# Patient Record
Sex: Male | Born: 1990 | Race: Black or African American | Hispanic: No | Marital: Single | Smoking: Current every day smoker
Health system: Southern US, Community
[De-identification: ages and names within clinical notes are randomized; demographics above are authoritative.]

---
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
## 2009-03-08 ENCOUNTER — Ambulatory Visit: Payer: Self-pay | Admitting: Internal Medicine

## 2009-04-17 ENCOUNTER — Ambulatory Visit: Payer: Self-pay | Admitting: Family Medicine

## 2009-04-17 ENCOUNTER — Encounter (INDEPENDENT_AMBULATORY_CARE_PROVIDER_SITE_OTHER): Payer: Self-pay | Admitting: Family Medicine

## 2009-04-17 LAB — CONVERTED CEMR LAB
AST: 17 units/L (ref 0–37)
Alkaline Phosphatase: 73 units/L (ref 39–117)
BUN: 16 mg/dL (ref 6–23)
Basophils Absolute: 0 10*3/uL (ref 0.0–0.1)
Basophils Relative: 0 % (ref 0–1)
Creatinine, Ser: 0.8 mg/dL (ref 0.40–1.50)
Eosinophils Absolute: 0.1 10*3/uL (ref 0.0–0.7)
Eosinophils Relative: 2 % (ref 0–5)
Glucose, Bld: 86 mg/dL (ref 70–99)
HCT: 44.6 % (ref 39.0–52.0)
HDL: 53 mg/dL (ref >39)
LDL Cholesterol: 70 mg/dL (ref 0–99)
Lymphocytes Relative: 40 % (ref 12–46)
MCHC: 30.3 g/dL (ref 30.0–36.0)
Monocytes Absolute: 0.3 10*3/uL (ref 0.1–1.0)
Platelets: 178 10*3/uL (ref 150–400)
Potassium: 4.5 meq/L (ref 3.5–5.3)
RDW: 14.7 % (ref 11.5–15.5)
Total Bilirubin: 0.5 mg/dL (ref 0.3–1.2)
Total CHOL/HDL Ratio: 2.5
Triglycerides: 44 mg/dL (ref <150)
VLDL: 9 mg/dL (ref 0–40)

## 2009-05-03 ENCOUNTER — Ambulatory Visit: Payer: Self-pay | Admitting: Internal Medicine

## 2009-10-19 ENCOUNTER — Emergency Department (HOSPITAL_COMMUNITY): Admission: EM | Admit: 2009-10-19 | Discharge: 2009-10-19 | Payer: Self-pay | Admitting: Emergency Medicine

## 2010-08-29 IMAGING — CR DG FINGER LITTLE 2+V*R*
3 series · 3 of 3 positions shown · non-contrast
Comparison: None

CLINICAL DATA: Injury

RIGHT LITTLE FINGER 2+V

[x finger pa right]
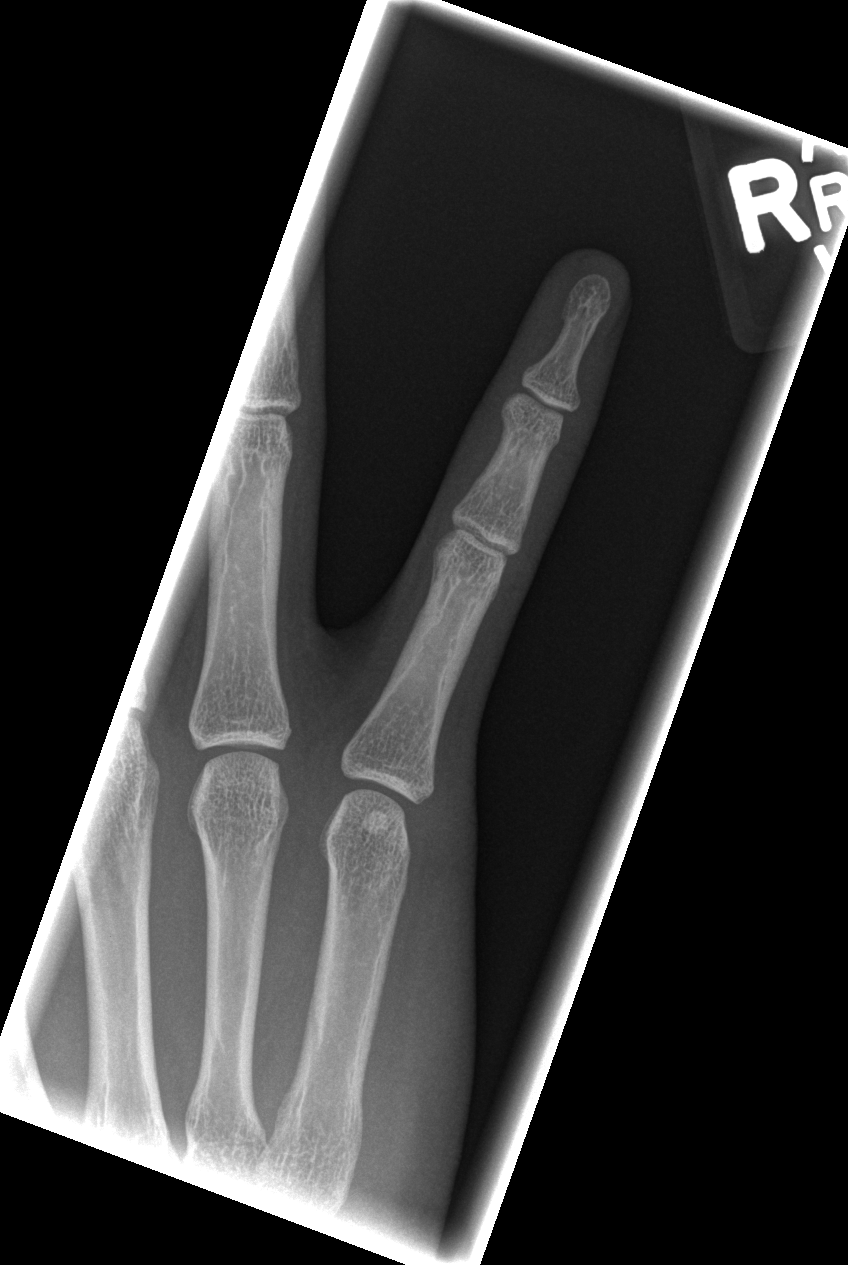

[x finger obl. right]
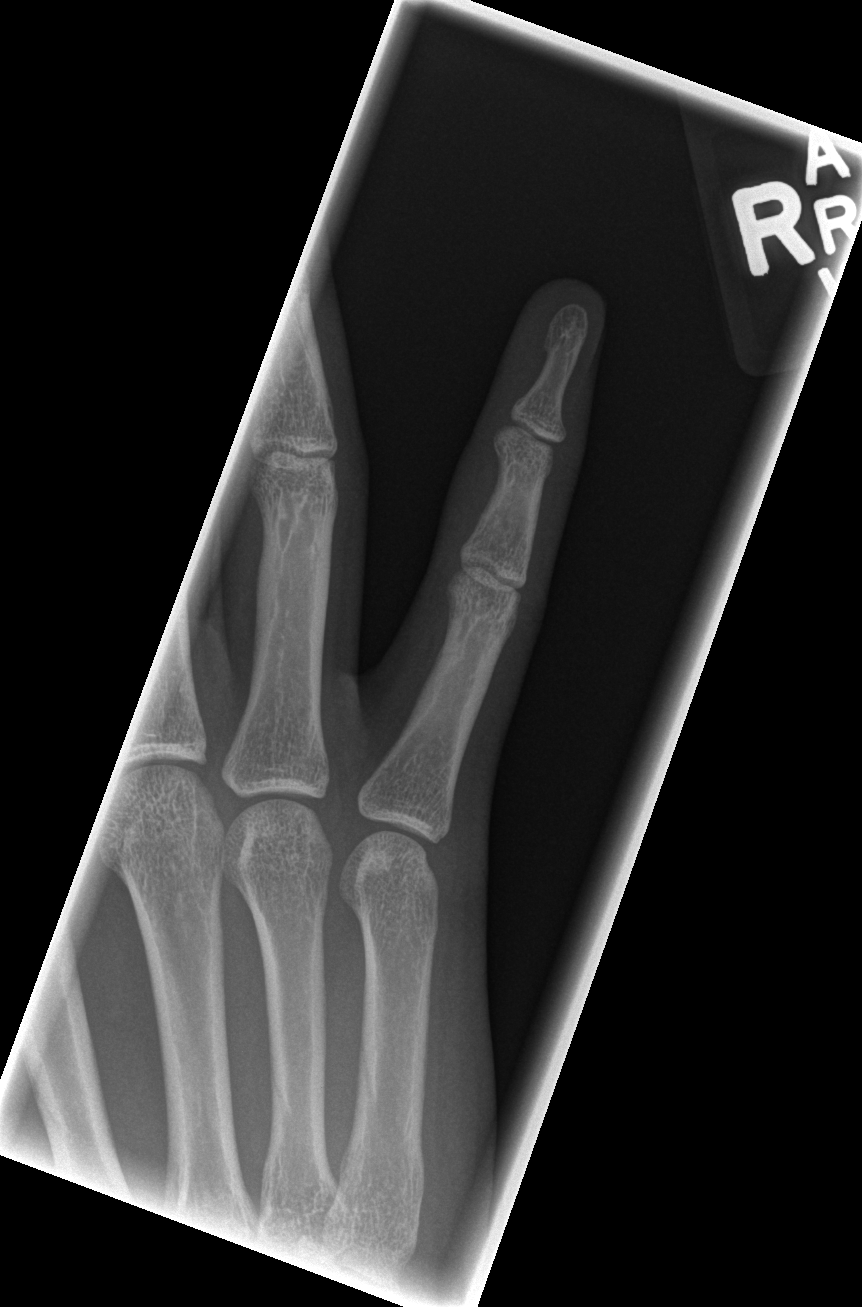

[x finger lateral right]
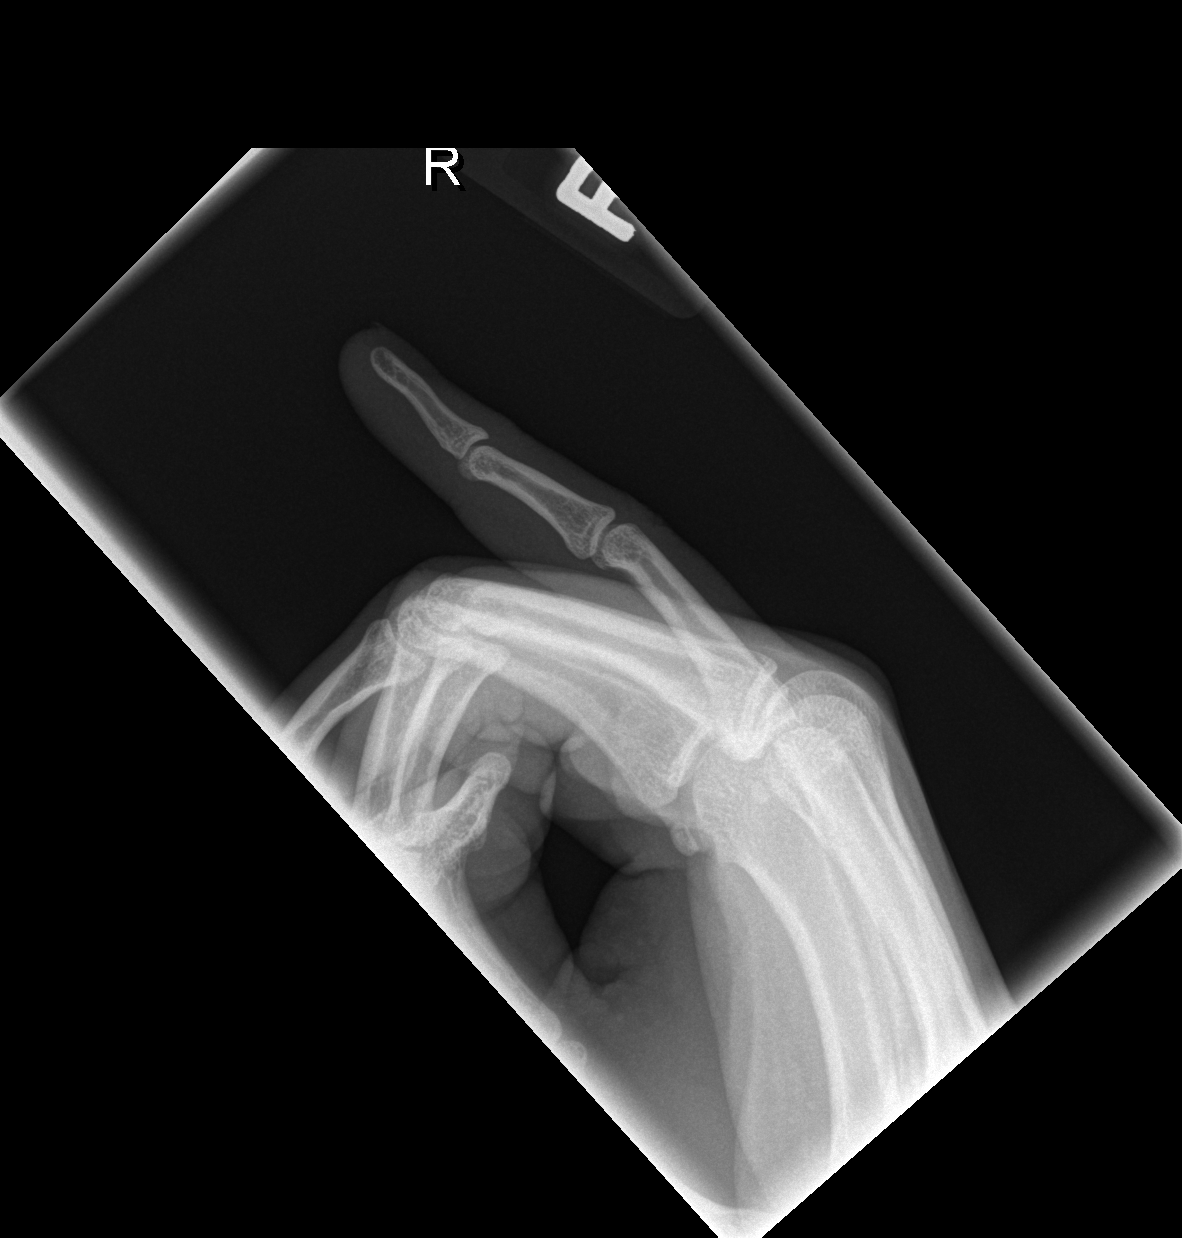

[3 of 3 positions shown; findings below may reference images not displayed]

FINDINGS: Three views of the right fifth finger submitted.  No
acute fracture or subluxation.
IMPRESSION: No acute fracture or subluxation.

## 2010-12-10 ENCOUNTER — Emergency Department (HOSPITAL_COMMUNITY)
Admission: EM | Admit: 2010-12-10 | Discharge: 2010-12-10 | Disposition: A | Discharge status: Nursing Home (Includes ICF) | Payer: Self-pay | Source: Home / Self Care | Attending: Emergency Medicine | Admitting: Emergency Medicine

## 2010-12-10 ENCOUNTER — Encounter (HOSPITAL_COMMUNITY): Payer: Self-pay | Admitting: Emergency Medicine

## 2010-12-10 ENCOUNTER — Encounter: Payer: Self-pay | Admitting: Cardiology

## 2010-12-10 ENCOUNTER — Emergency Department (HOSPITAL_COMMUNITY)
Admission: EM | Admit: 2010-12-10 | Discharge: 2010-12-10 | Discharge status: Against Medical Advice | Payer: Self-pay | Attending: Emergency Medicine | Admitting: Emergency Medicine

## 2010-12-10 DIAGNOSIS — Z0389 Encounter for observation for other suspected diseases and conditions ruled out: Secondary | ICD-10-CM | POA: Insufficient documentation

## 2010-12-10 DIAGNOSIS — T148XXA Other injury of unspecified body region, initial encounter: Secondary | ICD-10-CM

## 2010-12-10 MED ORDER — CEPHALEXIN 500 MG PO CAPS: 500.0000 mg | ORAL_CAPSULE | Freq: Three times a day (TID) | ORAL | Status: AC

## 2010-12-10 MED ORDER — DICLOFENAC SODIUM 75 MG PO TBEC: 75.0000 mg | DELAYED_RELEASE_TABLET | Freq: Two times a day (BID) | ORAL | Status: AC

## 2010-12-10 NOTE — ED Notes (Signed)
Mother reports lacerations to lateral side of left upper extremity and lateral side of neck just below hair line. They happened yesterday and were made with a metal pole with rubber covering. Denies fever. Last tetanus shot 2010. No drainage noted from wounds at this time. Neck wound approx 1/2 in length. Arm wound approx 1/2 in length. Mother had applied neosporin and bacitracin to wounds yesterday and applied dry dressings.
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
## 2011-05-03 ENCOUNTER — Emergency Department (HOSPITAL_COMMUNITY)
Admission: EM | Admit: 2011-05-03 | Discharge: 2011-05-03 | Disposition: A | Discharge status: Home / Self Care | Payer: Self-pay | Attending: Emergency Medicine | Admitting: Emergency Medicine

## 2011-05-03 ENCOUNTER — Encounter (HOSPITAL_COMMUNITY): Payer: Self-pay

## 2011-05-03 DIAGNOSIS — J069 Acute upper respiratory infection, unspecified: Secondary | ICD-10-CM | POA: Insufficient documentation

## 2011-05-03 DIAGNOSIS — J40 Bronchitis, not specified as acute or chronic: Secondary | ICD-10-CM | POA: Insufficient documentation

## 2011-05-03 MED ORDER — HYDROCODONE-ACETAMINOPHEN 7.5-500 MG/15ML PO SOLN: 15.0000 mL | Freq: Four times a day (QID) | ORAL | Status: AC | PRN

## 2011-05-03 MED ORDER — AZITHROMYCIN 250 MG PO TABS: 500.0000 mg | ORAL_TABLET | Freq: Every day | ORAL | Status: AC

## 2011-05-03 NOTE — Discharge Instructions (Signed)
Bronchitis Bronchitis is the body's way of reacting to injury and/or infection (inflammation) of the bronchi. Bronchi are the air tubes that extend from the windpipe into the lungs. If the inflammation becomes severe, it may cause shortness of breath. CAUSES  Inflammation may be caused by:  A virus.   Germs (bacteria).   Dust.   Allergens.   Pollutants and many other irritants.  The cells lining the bronchial tree are covered with tiny hairs (cilia). These constantly beat upward, away from the lungs, toward the mouth. This keeps the lungs free of pollutants. When these cells become too irritated and are unable to do their job, mucus begins to develop. This causes the characteristic cough of bronchitis. The cough clears the lungs when the cilia are unable to do their job. Without either of these protective mechanisms, the mucus would settle in the lungs. Then you would develop pneumonia. Smoking is a common cause of bronchitis and can contribute to pneumonia. Stopping this habit is the single most important thing you can do to help yourself. TREATMENT   Your caregiver may prescribe an antibiotic if the cough is caused by bacteria. Also, medicines that open up your airways make it easier to breathe. Your caregiver may also recommend or prescribe an expectorant. It will loosen the mucus to be coughed up. Only take over-the-counter or prescription medicines for pain, discomfort, or fever as directed by your caregiver.   Removing whatever causes the problem (smoking, for example) is critical to preventing the problem from getting worse.   Cough suppressants may be prescribed for relief of cough symptoms.   Inhaled medicines may be prescribed to help with symptoms now and to help prevent problems from returning.   For those with recurrent (chronic) bronchitis, there may be a need for steroid medicines.  SEEK IMMEDIATE MEDICAL CARE IF:   During treatment, you develop more pus-like mucus  (purulent sputum).   You have a fever.   Your baby is older than 3 months with a rectal temperature of 102 F (38.9 C) or higher.   Your baby is 17 months old or younger with a rectal temperature of 100.4 F (38 C) or higher.   You become progressively more ill.   You have increased difficulty breathing, wheezing, or shortness of breath.  It is necessary to seek immediate medical care if you are elderly or sick from any other disease. MAKE SURE YOU:   Understand these instructions.   Will watch your condition.   Will get help right away if you are not doing well or get worse.  Document Released: 01/13/2005 Document Revised: 01/02/2011 Document Reviewed: 11/23/2007 [facility] Patient Information 2012 [place], [place].   RESOURCE GUIDE  Dental Problems  Patients with Medicaid: [facility]                     [phone] [address]                                           Phone:  [phone]                                                  If unable to pay or uninsured, contact:  [facility]  or [facility]. to become qualified for the adult dental clinic.  Chronic Pain Problems Contact [facility]  [phone] Patients need to be referred by their primary care doctor.  Insufficient Money for Medicine Contact [employer] Way:  call "211" or [facility] [phone].  No Primary Care Doctor Call [facility]  [phone] Other agencies that provide inexpensive medical care    [facility]  [phone]    [facility]  [phone]    [facility]  [phone]    [facility]  [phone]    [facility]  ([phone]    [facility]  [phone]  Substance Abuse Resources [facility]  [phone] [facility] [phone])744-2173 [facility] 2106200833 Floydene Flock [phone] Residential & Outpatient Substance Abuse Program  9541100047  Psychological  Services [facility]  [phone] [facility]  [phone] [facility]   6713235520 (emergency services [phone])  Abuse/Neglect [facility] Child Abuse Hotline 587-690-9771 [facility] Child Abuse Hotline (423) 197-7559 (After Hours)  Emergency Shelter [facility] 2187538245  Maternity Homes [facility] [phone] [place] Alert Services [phone]  MRSA Hotline #:   (205)154-7643    [facility] Resources  [facility]  [employer]                           [facility] Dept. [address] [place]                     [address]         [address]  Blondell Reveal Phone:  [phone]                                  Phone:  [phone]                   Phone:  [phone]  [facility] Mental [facility] Phone:  [phone]  [facility] Child Abuse Hotline [phone] (After Hours)

## 2011-05-03 NOTE — ED Notes (Signed)
Pt in with stated flu like sx since Monday denies n/v/d states productive cough and pain in chest when cough also states head congestion denies fever

## 2011-05-03 NOTE — ED Provider Notes (Signed)
History     CSN: [id number]  Arrival date & time 05/03/11  4098   First MD Initiated Contact with Patient 05/03/11 [phone]      Chief Complaint  Patient presents with  . Influenza    (Consider location/radiation/quality/duration/timing/severity/associated sxs/prior treatment) HPI  21 year old male presents with flulike symptoms. Patient states for the past 5 days he has been having nasal congestion, runny nose, head congestion, sore throat, cough productive with yellow sputum, chest discomfort, and chest congestion. He did experiencing some subjective fever and chills which lasted for 1 day but has resolved. Symptoms persistent. Has tried over-the-counter NyQuil with minimal relief. Complaining of increased pleuritic chest pain with cough. Patient denies nausea, vomiting, diarrhea, abdominal pain, back pain, dysuria, or rash. Denies trouble swallowing, changes in voice, or having tonsilar exudates.  He denies loss of appetite. He admits to being a smoker but denies any recent alcohol or recreational drug use.  Denies hemoptysis  No past medical history on file.  No past surgical history on file.  Family History  Problem Relation Age of Onset  . Diabetes Mother   . Hypertension Father     History  Substance Use Topics  . Smoking status: Current Everyday Smoker -- 1.0 packs/day  . Smokeless tobacco: Not on file  . Alcohol Use: No      Review of Systems  All other systems reviewed and are negative.    Allergies  Review of patient's allergies indicates no known allergies.  Home Medications   Current Outpatient Rx  Name Route Sig Dispense Refill  . DICLOFENAC SODIUM 75 MG PO TBEC Oral Take 1 tablet (75 mg total) by mouth 2 (two) times daily. 20 tablet 0  . IBUPROFEN 200 MG PO CAPS Oral Take 400 mg by mouth daily as needed. For pain.    Marland Kitchen ONE-DAILY MULTI VITAMINS PO TABS Oral Take 1 tablet by mouth daily.        BP 140/74  Pulse 86  Temp(Src) 97.9 F (36.6 C) (Oral)   Resp 18  SpO2 100%  Physical Exam  Nursing note and vitals reviewed. Constitutional: He appears well-developed and well-nourished. No distress.       Awake, alert, nontoxic appearance  HENT:  Head: Normocephalic and atraumatic.  Right Ear: External ear normal.  Left Ear: External ear normal.  Nose: Nose normal.  Mouth/Throat: Oropharynx is clear and moist. No oropharyngeal exudate.       Mild tonsillar erythema noted bilaterally without evidence of exudates, or rash. No peritonsillar abscess. No Ludwig's angina.   Eyes: Conjunctivae and EOM are normal. Pupils are equal, round, and reactive to light. Right eye exhibits no discharge. Left eye exhibits no discharge.  Neck: Normal range of motion. Neck supple.  Cardiovascular: Normal rate and regular rhythm.   Pulmonary/Chest: Effort normal. No stridor. No respiratory distress. He has no wheezes. He has no rales. He exhibits no tenderness.  Abdominal: Soft. There is no tenderness. There is no rebound.  Musculoskeletal: He exhibits no tenderness.       ROM appears intact, no obvious focal weakness  Lymphadenopathy:    He has cervical adenopathy.  Neurological: He is alert.  Skin: Skin is warm and dry. No rash noted.  Psychiatric: He has a normal mood and affect.    ED Course  Procedures (including critical care time)  Labs Reviewed - No data to display No results found.   No diagnosis found.    MDM  Sxs suggestive of URI.  However, pt  is a smoker and has been complaining of chest congestion.  Lung exam is unremarkable.  PERC negative. Pt is afebrile with stable normal VS and satting at 100% on RA.  Will prescribe Lortab elixir for cough and pain, and Zpak for bronchitis.  Pt voice understanding and agrees with plain.  F/u instruction given.          Fayrene Helper, PA-C 05/03/11 0981  Fayrene Helper, PA-C 05/03/11 [phone]

## 2011-05-04 NOTE — ED Provider Notes (Signed)
Medical screening examination/treatment/procedure(s) were performed by non-physician practitioner and as supervising physician I was immediately available for consultation/collaboration.   Gavin Pound. Nilsa Macht, MD 05/04/11 0730
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
## 2013-12-02 ENCOUNTER — Emergency Department (HOSPITAL_COMMUNITY)
Admission: EM | Admit: 2013-12-02 | Discharge: 2013-12-02 | Disposition: A | Discharge status: Home / Self Care | Payer: BC Managed Care – PPO | Source: Home / Self Care | Attending: Emergency Medicine | Admitting: Emergency Medicine

## 2013-12-02 ENCOUNTER — Encounter (HOSPITAL_COMMUNITY): Payer: Self-pay | Admitting: Emergency Medicine

## 2013-12-02 DIAGNOSIS — J029 Acute pharyngitis, unspecified: Secondary | ICD-10-CM

## 2013-12-02 LAB — POCT RAPID STREP A: Streptococcus, Group A Screen (Direct): NEGATIVE

## 2013-12-02 MED ORDER — DEXAMETHASONE 4 MG PO TABS
ORAL_TABLET | ORAL | Status: AC
  Filled 2013-12-02: qty 2

## 2013-12-02 MED ORDER — DEXAMETHASONE 4 MG PO TABS
8.0000 mg | ORAL_TABLET | Freq: Once | ORAL | Status: AC
  Administered 2013-12-02: 8 mg via ORAL

## 2013-12-02 NOTE — Discharge Instructions (Signed)
Try gargling with salt water and/or ibuprofen as directed on the package for pain if needed. Drink lots of liquids.    Pharyngitis Pharyngitis is a sore throat (pharynx). There is redness, pain, and swelling of your throat. HOME CARE   Drink enough fluids to keep your pee (urine) clear or pale yellow.  Only take medicine as told by your doctor.  You may get sick again if you do not take medicine as told. Finish your medicines, even if you start to feel better.  Do not take aspirin.  Rest.  Rinse your mouth (gargle) with salt water ( tsp of salt per 1 qt of water) every 1-2 hours. This will help the pain.  If you are not at risk for choking, you can suck on hard candy or sore throat lozenges. GET HELP IF:  You have large, tender lumps on your neck.  You have a rash.  You cough up green, yellow-brown, or bloody spit. GET HELP RIGHT AWAY IF:   You have a stiff neck.  You drool or cannot swallow liquids.  You throw up (vomit) or are not able to keep medicine or liquids down.  You have very bad pain that does not go away with medicine.  You have problems breathing (not from a stuffy nose). MAKE SURE YOU:   Understand these instructions.  Will watch your condition.  Will get help right away if you are not doing well or get worse. Document Released: 07/02/2007 Document Revised: 11/03/2012 Document Reviewed: 09/20/2012 [facility] Patient Information 2015 [place]. This information is not intended to replace advice given to you by your health care provider. Make sure you discuss any questions you have with your health care provider.

## 2013-12-02 NOTE — ED Provider Notes (Signed)
CSN: [id number]     Arrival date & time 12/02/13  1921 History   First MD Initiated Contact with Patient 12/02/13 2030     Chief Complaint  Patient presents with  . Sore Throat   (Consider location/radiation/quality/duration/timing/severity/associated sxs/prior Treatment) HPI Comments: Pt felt onset of pain around 5pm tonight. Only on R side, from back part of tongue to R side of throat.   Patient is a 23 y.o. male presenting with pharyngitis. The history is provided by the patient.  Sore Throat This is a new problem. The current episode started 3 to 5 hours ago. The problem occurs constantly. The problem has been gradually worsening. Pertinent negatives include no abdominal pain and no headaches. The symptoms are aggravated by swallowing. Nothing relieves the symptoms. He has tried nothing for the symptoms.    History reviewed. No pertinent past medical history. History reviewed. No pertinent past surgical history. Family History  Problem Relation Age of Onset  . Diabetes Mother   . Hypertension Father    History  Substance Use Topics  . Smoking status: Current Every Day Smoker -- 0.50 packs/day  . Smokeless tobacco: Not on file  . Alcohol Use: Yes    Review of Systems  Constitutional: Positive for chills. Negative for fever.  HENT: Positive for sore throat. Negative for congestion, postnasal drip, rhinorrhea and trouble swallowing.   Respiratory: Negative for cough.   Gastrointestinal: Negative for abdominal pain.  Neurological: Negative for headaches.  Hematological: Negative for adenopathy.    Allergies  Review of patient's allergies indicates no known allergies.  Home Medications   Prior to Admission medications   Medication Sig Start Date End Date Taking? Authorizing Provider  Ibuprofen 200 MG CAPS Take 400 mg by mouth daily as needed. For pain.    Historical Provider, MD  Multiple Vitamin (MULTIVITAMIN) tablet Take 1 tablet by mouth daily.      Historical  Provider, MD   BP 129/89 mmHg  Pulse 68  Temp(Src) 97.9 F (36.6 C) (Oral)  Resp 16  SpO2 100% Physical Exam  Constitutional: He appears well-developed and well-nourished. No distress.  HENT:  Right Ear: Tympanic membrane, external ear and ear canal normal.  Left Ear: Tympanic membrane, external ear and ear canal normal.  Nose: Nose normal. Right sinus exhibits no maxillary sinus tenderness and no frontal sinus tenderness. Left sinus exhibits no maxillary sinus tenderness and no frontal sinus tenderness.  Mouth/Throat: Oropharynx is clear and moist and mucous membranes are normal.  Uvula deviates to the R when pt lifts soft palate.   Cardiovascular: Normal rate and regular rhythm.   Pulmonary/Chest: Effort normal and breath sounds normal.  Lymphadenopathy:       Head (right side): No submental, no submandibular and no tonsillar adenopathy present.       Head (left side): No submental, no submandibular and no tonsillar adenopathy present.    He has no cervical adenopathy.    ED Course  Procedures (including critical care time) Labs Review Labs Reviewed  POCT RAPID STREP A ([facility] CARE ONLY)    Imaging Review No results found.   MDM   1. Pharyngitis    Rapid strep negative. Most likely viral pharyngitis. [facility]. Suggested salt water gargles and/or ibuprofen prn for pain at home.     Cathlyn ParsonsAngela M Jules Vidovich, NP 12/02/13 2118

## 2013-12-02 NOTE — ED Notes (Signed)
Pt states that his throat is sore and it hurts to swallow symptoms started today while he was at work.

## 2013-12-04 ENCOUNTER — Encounter (HOSPITAL_COMMUNITY): Payer: Self-pay | Admitting: Emergency Medicine

## 2013-12-04 ENCOUNTER — Emergency Department (HOSPITAL_COMMUNITY)
Admission: EM | Admit: 2013-12-04 | Discharge: 2013-12-04 | Disposition: A | Discharge status: Home / Self Care | Payer: BC Managed Care – PPO | Attending: Emergency Medicine | Admitting: Emergency Medicine

## 2013-12-04 DIAGNOSIS — Z720 Tobacco use: Secondary | ICD-10-CM | POA: Insufficient documentation

## 2013-12-04 DIAGNOSIS — Z79899 Other long term (current) drug therapy: Secondary | ICD-10-CM | POA: Insufficient documentation

## 2013-12-04 DIAGNOSIS — J029 Acute pharyngitis, unspecified: Secondary | ICD-10-CM

## 2013-12-04 MED ORDER — HYDROCODONE-ACETAMINOPHEN 7.5-325 MG/15ML PO SOLN: 10.0000 mL | ORAL | Status: DC | PRN

## 2013-12-04 MED ORDER — HYDROCODONE-ACETAMINOPHEN 7.5-325 MG/15ML PO SOLN
10.0000 mL | Freq: Once | ORAL | Status: AC
  Administered 2013-12-04: 10 mL via ORAL
  Filled 2013-12-04: qty 15

## 2013-12-04 NOTE — ED Provider Notes (Signed)
CSN: [id number]     Arrival date & time 12/04/13  0419 History   First MD Initiated Contact with Patient 12/04/13 [id number]     Chief Complaint  Patient presents with  . Sore Throat     (Consider location/radiation/quality/duration/timing/severity/associated sxs/prior Treatment) HPI Comments: Patient was seen in urgent care several days ago for sore throat.  He was strep negative.  He was given a dose of dexamethasone for swelling and told to take ibuprofen or Tylenol for discomfort presents with unresolved pain, stating he feels like he swallowing 9 and his throat is closing up, although he is speaking in full sentences.  His O2 sat is 100% on room air and he is afebrile  Patient is a 23 y.o. male presenting with pharyngitis. The history is provided by the patient.  Sore Throat This is a recurrent problem. The current episode started in the past 7 days. The problem occurs constantly. The problem has been unchanged. Associated symptoms include a sore throat. Pertinent negatives include no abdominal pain, anorexia, chills, coughing, fever, rash or swollen glands. The symptoms are aggravated by swallowing. He has tried NSAIDs for the symptoms. The treatment provided no relief.    History reviewed. No pertinent past medical history. History reviewed. No pertinent past surgical history. Family History  Problem Relation Age of Onset  . Diabetes Mother   . Hypertension Father    History  Substance Use Topics  . Smoking status: Current Every Day Smoker -- 0.50 packs/day  . Smokeless tobacco: Not on file  . Alcohol Use: Yes    Review of Systems  Constitutional: Negative for fever and chills.  HENT: Positive for sore throat. Negative for rhinorrhea.   Respiratory: Positive for shortness of breath. Negative for cough.   Gastrointestinal: Negative for abdominal pain and anorexia.  Skin: Negative for rash.      Allergies  Review of patient's allergies indicates no known allergies.  Home  Medications   Prior to Admission medications   Medication Sig Start Date End Date Taking? Authorizing Provider  Ibuprofen 200 MG CAPS Take 200 mg by mouth daily as needed. For pain.   Yes Historical Provider, MD  phenol (CHLORASEPTIC) 1.4 % LIQD Use as directed 1 spray in the mouth or throat as needed for throat irritation / pain.   Yes Historical Provider, MD  HYDROcodone-acetaminophen (HYCET) 7.5-325 mg/15 ml solution Take 10 mLs by mouth every 4 (four) hours as needed for moderate pain. 12/04/13   Arman FilterGail K Brittain Smithey, NP  Multiple Vitamin (MULTIVITAMIN) tablet Take 1 tablet by mouth daily.      Historical Provider, MD   BP 136/70 mmHg  Pulse 92  Temp(Src) 98.3 F (36.8 C) (Oral)  Resp 18  Ht 6' (1.829 m)  Wt 145 lb (65.772 kg)  BMI 19.66 kg/m2  SpO2 100% Physical Exam  Constitutional: He appears well-developed and well-nourished.  HENT:  Head: Normocephalic.  Mouth/Throat: Uvula is midline. No uvula swelling. Posterior oropharyngeal erythema present. No oropharyngeal exudate, posterior oropharyngeal edema or tonsillar abscesses.  Neck: Normal range of motion.  Cardiovascular: Normal rate and regular rhythm.   Pulmonary/Chest: Effort normal and breath sounds normal.  Musculoskeletal: Normal range of motion.  Lymphadenopathy:    He has no cervical adenopathy.  Neurological: He is alert.  Skin: Skin is warm and dry.  Nursing note and vitals reviewed.   ED Course  Procedures (including critical care time) Labs Review Labs Reviewed - No data to display  Imaging Review No results found.  EKG Interpretation None     Physical examination, there is no uvula swelling.  There is no exudative tonsillitis.  There is mild erythema of the posterior pharynx.  He has been given a dose of Lortab elixir and discharge home with same MDM   Final diagnoses:  Pharyngitis         Arman FilterGail K Offie Pickron, NP 12/04/13 0530  April K Palumbo-Rasch, MD 12/04/13 [phone]

## 2013-12-04 NOTE — ED Notes (Signed)
Pt arrived to the ED with a complaint of a sore throat which the pt feels is swelling.  Pt states the throat started Friday.  Pt states that it is difficulty and painful swallowing.

## 2013-12-04 NOTE — ED Notes (Signed)
Patient c/o sore throat and difficulty swallowing. Patient was seen at Urgent Care for same two days ago and reports Ibuprofen three times daily with no relief. Patient is A&O x4 and speaking in complete sentences.

## 2013-12-05 LAB — CULTURE, GROUP A STREP

## 2015-09-06 ENCOUNTER — Emergency Department (HOSPITAL_COMMUNITY)
Admission: EM | Admit: 2015-09-06 | Discharge: 2015-09-07 | Disposition: A | Discharge status: Home / Self Care | Payer: Self-pay | Attending: Emergency Medicine | Admitting: Emergency Medicine

## 2015-09-06 ENCOUNTER — Encounter (HOSPITAL_COMMUNITY): Payer: Self-pay | Admitting: Emergency Medicine

## 2015-09-06 DIAGNOSIS — F17200 Nicotine dependence, unspecified, uncomplicated: Secondary | ICD-10-CM | POA: Insufficient documentation

## 2015-09-06 DIAGNOSIS — J029 Acute pharyngitis, unspecified: Secondary | ICD-10-CM | POA: Insufficient documentation

## 2015-09-06 LAB — RAPID STREP SCREEN (MED CTR MEBANE ONLY): STREPTOCOCCUS, GROUP A SCREEN (DIRECT): NEGATIVE

## 2015-09-06 NOTE — ED Triage Notes (Signed)
Patient presents for sore throat x1 week. Denies fever, difficulty swallowing or N/V. Speaking in complete sentences.

## 2015-09-07 MED ORDER — HYDROCODONE-ACETAMINOPHEN 7.5-325 MG/15ML PO SOLN: 10.0000 mL | Freq: Four times a day (QID) | ORAL | 0 refills | Status: AC | PRN

## 2015-09-07 MED ORDER — PREDNISONE 20 MG PO TABS: 40.0000 mg | ORAL_TABLET | Freq: Every day | ORAL | 0 refills | Status: AC

## 2015-09-07 NOTE — ED Provider Notes (Signed)
[facility]-EMERGENCY DEPT Provider Note   CSN: [id number] Arrival date & time: 09/06/15  2158  First Provider Contact:  None      History   Chief Complaint Chief Complaint  Patient presents with  . Sore Throat    HPI Jonathan Pope is a 25 y.o. male.  The history is provided by the patient. No language interpreter was used.  Sore Throat  This is a new problem. Episode onset: 1 week ago. The problem occurs constantly. The problem has been gradually worsening. Pertinent negatives include no abdominal pain, no headaches and no shortness of breath. The symptoms are aggravated by swallowing. Nothing relieves the symptoms. Treatments tried: NSAIDs. The treatment provided mild relief.    History reviewed. No pertinent past medical history.  There are no active problems to display for this patient.   History reviewed. No pertinent surgical history.     Home Medications    Prior to Admission medications   Medication Sig Start Date End Date Taking? Authorizing Provider  HYDROcodone-acetaminophen (HYCET) 7.5-325 mg/15 ml solution Take 10 mLs by mouth every 6 (six) hours as needed for moderate pain or severe pain. 09/07/15   Antony MaduraKelly Leilanie Rauda, PA-C  Ibuprofen 200 MG CAPS Take 200 mg by mouth daily as needed. For pain.    Historical Provider, MD  Multiple Vitamin (MULTIVITAMIN) tablet Take 1 tablet by mouth daily.      Historical Provider, MD  phenol (CHLORASEPTIC) 1.4 % LIQD Use as directed 1 spray in the mouth or throat as needed for throat irritation / pain.    Historical Provider, MD  predniSONE (DELTASONE) 20 MG tablet Take 2 tablets (40 mg total) by mouth daily. 09/07/15   Antony MaduraKelly Karlita Lichtman, PA-C    Family History Family History  Problem Relation Age of Onset  . Diabetes Mother   . Hypertension Father     Social History Social History  Substance Use Topics  . Smoking status: Current Every Day Smoker    Packs/day: 0.50  . Smokeless tobacco: Never Used  . Alcohol use Yes      Allergies   Review of patient's allergies indicates no known allergies.   Review of Systems Review of Systems  Constitutional: Negative for fever.  HENT: Positive for sore throat. Negative for congestion, drooling and rhinorrhea.   Respiratory: Negative for cough and shortness of breath.   Gastrointestinal: Negative for abdominal pain.  Neurological: Negative for headaches.  Ten systems reviewed and are negative for acute change, except as noted in the HPI.     Physical Exam Updated Vital Signs BP 123/95 (BP Location: Right Arm)   Pulse 87   Temp 99.4 F (37.4 C) (Oral)   Resp 16   SpO2 100%   Physical Exam  Constitutional: He is oriented to person, place, and time. He appears well-developed and well-nourished. No distress.  Nontoxic appearing and in no distress  HENT:  Head: Normocephalic and atraumatic.  Right Ear: External ear normal.  Left Ear: External ear normal.  Bilateral tonsillar enlargement with erythema. There are white, patchy exudates. Uvula midline. Patient tolerated secretions without difficulty. No voice changes. No trismus or tripoding.   Eyes: Conjunctivae and EOM are normal. No scleral icterus.  Neck: Normal range of motion.  No nuchal rigidity or meningismus  Pulmonary/Chest: Effort normal. No respiratory distress.  Respirations even and unlabored  Musculoskeletal: Normal range of motion.  Neurological: He is alert and oriented to person, place, and time.  Skin: Skin is warm and dry. No rash noted.  He is not diaphoretic. No erythema. No pallor.  Psychiatric: He has a normal mood and affect. His behavior is normal.  Nursing note and vitals reviewed.    ED Treatments / Results  Labs (all labs ordered are listed, but only abnormal results are displayed) Labs Reviewed  RAPID STREP SCREEN (NOT AT [facility])  CULTURE, GROUP A STREP [facility])    EKG  EKG Interpretation None       Radiology No results found.  Procedures Procedures  (including critical care time)  Medications Ordered in ED Medications - No data to display   Initial Impression / Assessment and Plan / ED Course  I have reviewed the triage vital signs and the nursing notes.  Pertinent labs & imaging results that were available during my care of the patient were reviewed by me and considered in my medical decision making (see chart for details).  Clinical Course    Pt afebrile with negative strep. Presents with 1 wk of tonsillar exudates and dysphagia; diagnosis of viral pharyngitis. No abx indicated. Will d/c with symptomatic tx for pain. Presentation not concerning for PTA or infxn spread to soft tissue. No trismus or uvula deviation. Specific return precautions discussed. Pt tolerating secretions without difficulty with intact air way. Recommended PCP follow up. Return precautions discussed and provided. Patient discharged in satisfactory condition with no unaddressed concerns.   Final Clinical Impressions(s) / ED Diagnoses   Final diagnoses:  Pharyngitis    New Prescriptions Discharge Medication List as of 09/07/2015  3:10 AM    START taking these medications   Details  predniSONE (DELTASONE) 20 MG tablet Take 2 tablets (40 mg total) by mouth daily., Starting Fri 09/07/2015, Print         [place], PA-C 09/07/15 2123    Paula Libra, MD 09/07/15 2249

## 2015-09-07 NOTE — Discharge Instructions (Signed)
Your strep test was negative. It is likely that your sore throat is due to a viral infection. Your body will fight this on its own without the need for antibiotics. Take prednisone as prescribed to limit swelling. You may also take Hycet as prescribed for sore throat (pain). Gargle with salt water after meals. Follow-up with a primary care doctor for persistent symptoms.

## 2015-09-09 LAB — CULTURE, GROUP A STREP (THRC)

## 2015-09-15 ENCOUNTER — Emergency Department (HOSPITAL_COMMUNITY)
Admission: EM | Admit: 2015-09-15 | Discharge: 2015-09-15 | Disposition: A | Discharge status: Home / Self Care | Payer: Self-pay | Attending: Emergency Medicine | Admitting: Emergency Medicine

## 2015-09-15 ENCOUNTER — Encounter (HOSPITAL_COMMUNITY): Payer: Self-pay | Admitting: *Deleted

## 2015-09-15 DIAGNOSIS — J029 Acute pharyngitis, unspecified: Secondary | ICD-10-CM

## 2015-09-15 DIAGNOSIS — Z7952 Long term (current) use of systemic steroids: Secondary | ICD-10-CM | POA: Insufficient documentation

## 2015-09-15 DIAGNOSIS — F17200 Nicotine dependence, unspecified, uncomplicated: Secondary | ICD-10-CM | POA: Insufficient documentation

## 2015-09-15 DIAGNOSIS — J039 Acute tonsillitis, unspecified: Secondary | ICD-10-CM | POA: Insufficient documentation

## 2015-09-15 DIAGNOSIS — Z79899 Other long term (current) drug therapy: Secondary | ICD-10-CM | POA: Insufficient documentation

## 2015-09-15 MED ORDER — HYDROCODONE-ACETAMINOPHEN 5-325 MG PO TABS: 1.0000 | ORAL_TABLET | ORAL | 0 refills | Status: AC | PRN

## 2015-09-15 MED ORDER — BENZOCAINE-MENTHOL 6-10 MG MT LOZG: 1.0000 | LOZENGE | OROMUCOSAL | 0 refills | Status: AC | PRN

## 2015-09-15 MED ORDER — IBUPROFEN 800 MG PO TABS: 800.0000 mg | ORAL_TABLET | Freq: Three times a day (TID) | ORAL | 0 refills | Status: AC

## 2015-09-15 MED ORDER — MAGIC MOUTHWASH W/LIDOCAINE: ORAL | 0 refills | Status: AC

## 2015-09-15 NOTE — ED Notes (Signed)
PA at bedside.

## 2015-09-15 NOTE — ED Provider Notes (Signed)
[facility]-EMERGENCY DEPT Provider Note   CSN: [id number] Arrival date & time: 09/15/15  1422  By signing my name below, I, Nelwyn SalisburyJoshua Fowler, attest that this documentation has been prepared under the direction and in the presence of non-physician practitioner, Lindell NoeSelena Horald Birky, PA-C. Electronically Signed: Nelwyn SalisburyJoshua Fowler, Scribe. 09/15/2015. 2:57 PM.  History   Chief Complaint Chief Complaint  Patient presents with  . Sore Throat   The history is provided by the patient. No language interpreter was used.     HPI Comments:  Jonathan Pope is a 25 y.o. male who presents to the Emergency Department complaining of constant worsening sore throat onset two weeks ago. He reports associated nausea, vomiting x1 today, right-sided facial pain. Occasionally lightheaded. Not now. No modifying factors indicated. Pt states he was seen last week, 09/06/15, for sore throat where he was prescribed hydrocodone and a round of prednisone. He finished the last of his prednisone and took an ibuprofen and hydrocodone earlier today. Denies fatigue. Pt denies any difficulty breathing, choking, coughing, drooling, fever, chills, change in appetite, or belly pain. On EMR review, rapid strap and culture were negative.  History reviewed. No pertinent past medical history.  There are no active problems to display for this patient.   History reviewed. No pertinent surgical history.  Home Medications    Prior to Admission medications   Medication Sig Start Date End Date Taking? Authorizing Provider  HYDROcodone-acetaminophen (HYCET) 7.5-325 mg/15 ml solution Take 10 mLs by mouth every 6 (six) hours as needed for moderate pain or severe pain. 09/07/15   Antony MaduraKelly Humes, PA-C  Ibuprofen 200 MG CAPS Take 200 mg by mouth daily as needed. For pain.    Historical Provider, MD  Multiple Vitamin (MULTIVITAMIN) tablet Take 1 tablet by mouth daily.      Historical Provider, MD  phenol (CHLORASEPTIC) 1.4 % LIQD Use as directed 1 spray in the  mouth or throat as needed for throat irritation / pain.    Historical Provider, MD  predniSONE (DELTASONE) 20 MG tablet Take 2 tablets (40 mg total) by mouth daily. 09/07/15   Antony MaduraKelly Humes, PA-C    Family History Family History  Problem Relation Age of Onset  . Diabetes Mother   . Hypertension Father     Social History Social History  Substance Use Topics  . Smoking status: Current Every Day Smoker    Packs/day: 0.50  . Smokeless tobacco: Never Used  . Alcohol use Yes     Comment: occasionally    Allergies   Review of patient's allergies indicates no known allergies.   Review of Systems Review of Systems  Constitutional: Negative for appetite change, chills and fever.  HENT: Negative for drooling.   Respiratory: Negative for cough, choking and shortness of breath.   Gastrointestinal: Positive for nausea and vomiting. Negative for abdominal pain.  Neurological: Positive for light-headedness.   10 Systems reviewed and are negative for acute change except as noted in the HPI.  Physical Exam Updated Vital Signs BP 118/78 (BP Location: Right Arm)   Pulse 69   Temp 99 F (37.2 C) (Oral)   Resp 19   Ht 6\' 1"  (1.854 m)   Wt 138 lb (62.6 kg)   SpO2 99%   BMI 18.21 kg/m   Physical Exam  Constitutional: He is oriented to person, place, and time. He appears well-developed and well-nourished. No distress.  HENT:  Head: Normocephalic and atraumatic.  Mouth/Throat: Oropharyngeal exudate present.  Right tonsil mildly edematous with erythema and areas  of exudate. Left tonsillar unremarkable. No posterior oral pharyngeal edema or erythema. Uvula midline. No PTA, trismus. No cervical or occipital lymphadenopathy.   Eyes: Conjunctivae are normal.  Cardiovascular: Normal rate.   Pulmonary/Chest: Effort normal.  Abdominal: He exhibits no distension.  Neurological: He is alert and oriented to person, place, and time.  Steady gait  Skin: Skin is warm and dry. No rash noted.    Psychiatric: He has a normal mood and affect.  Nursing note and vitals reviewed.  ED Treatments / Results  DIAGNOSTIC STUDIES:  Oxygen Saturation is 99% on RA, normal by my interpretation.    COORDINATION OF CARE:  3:15 PM Discussed treatment plan with pt at bedside which includes magic mouthwash, ibuprofen, and referral to ENT and pt agreed to plan.  Labs (all labs ordered are listed, but only abnormal results are displayed) Labs Reviewed - No data to display  EKG  EKG Interpretation None       Radiology No results found.  Procedures Procedures (including critical care time)  Medications Ordered in ED Medications - No data to display   Initial Impression / Assessment and Plan / ED Course  I have reviewed the triage vital signs and the nursing notes.  Pertinent labs & imaging results that were available during my care of the patient were reviewed by me and considered in my medical decision making (see chart for details).  Clinical Course   Suspect viral tonsillitis and pharyngitis. Culture for strep was negative. Pt without lympadenopathy, fever, malaise. No rashes. Doubt mono. No evidence of oropharyngeal abscess. No airway compromise. No trismus or drooling. Discussed option of labs with pt but I discussed with him that ultimately would likely be of no benefit. Will hold off on labs today. Will give rx for magic mouthwash, short course of norco, and ibuprofen. Encouraged ENT follow up if symptoms persist. ER return precautions given.  Final Clinical Impressions(s) / ED Diagnoses   Final diagnoses:  Tonsillitis  Sore throat    New Prescriptions Discharge Medication List as of 09/15/2015  3:17 PM    START taking these medications   Details  benzocaine-menthol (CHLORAEPTIC) 6-10 MG lozenge Take 1 lozenge by mouth as needed for sore throat., Starting Sat 09/15/2015, Print    HYDROcodone-acetaminophen (NORCO/VICODIN) 5-325 MG tablet Take 1 tablet by mouth every 4  (four) hours as needed for severe pain., Starting Sat 09/15/2015, Print    ibuprofen (ADVIL,MOTRIN) 800 MG tablet Take 1 tablet (800 mg total) by mouth 3 (three) times daily., Starting Sat 09/15/2015, Print    magic mouthwash w/lidocaine SOLN Swish, gargle, and spit. Use three times daily as needed for sore throat., Print      I personally performed the services described in this documentation, which was scribed in my presence. The recorded information has been reviewed and is accurate.   Carlene CoriaSerena Y Eymi Lipuma, PA-C 09/15/15 [id number]    Derwood KaplanAnkit Nanavati, MD 09/16/15 [phone]

## 2015-09-15 NOTE — ED Triage Notes (Signed)
Patient presents with sore throat x1 week and last week was dx here with viral pharyngitis after negative rapid strep.  Culture was also negative according to chart review.  Patient states his throat pain is getting worse despite completion of the prednisone.  Patient states he had 1 episode of vomiting last night, but denies diarrhea and fever.  On exam, patient's oropharnyx is exceptionally red and some what edematous, but airway is patent.  Exudates noted right tonsil.  Uvula midline.  External swelling noted to right lower jaw.

## 2015-10-30 ENCOUNTER — Emergency Department (HOSPITAL_COMMUNITY): Admission: EM | Admit: 2015-10-30 | Discharge: 2015-10-30 | Discharge status: Against Medical Advice | Payer: Self-pay

## 2015-10-30 NOTE — ED Notes (Signed)
NO response when called.
